# Patient Record
Sex: Male | Born: 2000 | Hispanic: Yes | Marital: Single | Smoking: Never smoker
Health system: Southern US, Community
[De-identification: ages and names within clinical notes are randomized; demographics above are authoritative.]

---
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
## 2019-03-29 ENCOUNTER — Other Ambulatory Visit: Payer: Self-pay

## 2019-03-29 ENCOUNTER — Emergency Department (HOSPITAL_COMMUNITY)
Admission: EM | Admit: 2019-03-29 | Discharge: 2019-03-29 | Disposition: A | Discharge status: Home / Self Care | Payer: Self-pay | Attending: Emergency Medicine | Admitting: Emergency Medicine

## 2019-03-29 DIAGNOSIS — Z046 Encounter for general psychiatric examination, requested by authority: Secondary | ICD-10-CM | POA: Insufficient documentation

## 2019-03-29 DIAGNOSIS — F19929 Other psychoactive substance use, unspecified with intoxication, unspecified: Secondary | ICD-10-CM | POA: Insufficient documentation

## 2019-03-29 DIAGNOSIS — F10920 Alcohol use, unspecified with intoxication, uncomplicated: Secondary | ICD-10-CM | POA: Insufficient documentation

## 2019-03-29 NOTE — ED Provider Notes (Signed)
[facility]-EMERGENCY DEPT Provider Note   CSN: [id number] Arrival date & time: 03/29/19  [id number]     History   Chief Complaint Chief Complaint  Patient presents with  . Alcohol Intoxication  . Medical Clearance    LEVEL 5 CAVEAT 2/2 AMS/INTOXICATION  HPI Jonathon Rogers is a 18 y.o. male.     18 year old male presents to the emergency department for medical clearance.  He was seen earlier in the evening, brought in by police after he was found in a vehicle at an intersection sleeping.  Had a legal blood draw and drug screen performed.  Was brought to jail for DUI charges, but was considered too intoxicated for acceptance.  Brought back to the emergency department for clearance.  Patient allegedly took multiple tablets of Xanax tonight.  He is lethargic and unable to contribute to history.  The history is provided by the patient. No language interpreter was used.  Alcohol Intoxication    No past medical history on file.  There are no active problems to display for this patient.   No past surgical history on file.      Home Medications    Prior to Admission medications   Medication Sig Start Date End Date Taking? Authorizing Provider  ibuprofen (ADVIL,MOTRIN) 400 MG tablet Take 1 tablet (400 mg total) by mouth every 6 (six) hours as needed. 06/18/15   Cheri Fowlerose, Kayla, PA-C    Family History No family history on file.  Social History Social History   Tobacco Use  . Smoking status: Never Smoker  Substance Use Topics  . Alcohol use: No  . Drug use: No     Allergies   Patient has no known allergies.   Review of Systems Review of Systems  Unable to perform ROS: Mental status change     Physical Exam Updated Vital Signs BP 102/66 (BP Location: Right Arm)   Pulse 77   Temp 97.8 F (36.6 C) (Oral)   Resp 16   SpO2 97%   Physical Exam Vitals signs and nursing note reviewed.  Constitutional:      General: He is not in acute distress.     Appearance: He is well-developed. He is not diaphoretic.     Comments: Alert to loud voice and sternal rubbing.  Will fall back to sleep when not stimulated.  HENT:     Head: Normocephalic and atraumatic.  Eyes:     General: No scleral icterus.    Conjunctiva/sclera: Conjunctivae normal.     Comments: Horizontal nystagmus to the left.  Neck:     Musculoskeletal: Normal range of motion.  Cardiovascular:     Rate and Rhythm: Normal rate and regular rhythm.     Pulses: Normal pulses.  Pulmonary:     Effort: Pulmonary effort is normal. No respiratory distress.     Comments: Respirations even and unlabored Musculoskeletal: Normal range of motion.  Skin:    General: Skin is warm and dry.     Coloration: Skin is not pale.     Findings: No erythema or rash.  Neurological:     Comments: Moving all extremities spontaneously  Psychiatric:        Behavior: Behavior normal.      ED Treatments / Results  Labs (all labs ordered are listed, but only abnormal results are displayed) Labs Reviewed - No data to display  EKG None  Radiology No results found.  Procedures Procedures (including critical care time)  Medications Ordered in ED Medications -  No data to display   Initial Impression / Assessment and Plan / ED Course  I have reviewed the triage vital signs and the nursing notes.  Pertinent labs & imaging results that were available during my care of the patient were reviewed by me and considered in my medical decision making (see chart for details).        18 year old presenting for medical clearance.  Is clinically intoxicated with suspected benzodiazepine ingestion.  Patient moving all extremities spontaneously.  Will awaken to sternal rubbing and loud voice, but promptly fall back to sleep when not stimulated.  He has reassuring vital signs upon my assessment at bedside.  Will require further metabolization prior to discharge.  Patient signed out to Armstead Peaks, PA-C at  change of shift who will reassess and disposition appropriately.   Final Clinical Impressions(s) / ED Diagnoses   Final diagnoses:  Drug intoxication with complication [facility])    ED Discharge Orders    None       Antonietta Breach, PA-C 03/29/19 9924    Fatima Blank, MD 03/29/19 [phone]

## 2019-03-29 NOTE — ED Notes (Signed)
Per [employer] officer, pt crawled out of bed and sat on the floor.  This RN entered room to pt sitting upright on the floor.  RN requested that pt return to bed for safety.  Pt assisted to stand, with unsteady gait, assisted back to bed. Provided water, per pt request.

## 2019-03-29 NOTE — ED Notes (Signed)
Pt to room 27, escorted by [employer]. [employer] at bedside.

## 2019-03-29 NOTE — Discharge Instructions (Signed)
Stop drinking alcohol and using drugs that are not prescribed to you.  Please return to the emergency department if you develop any new or worsening symptoms.

## 2019-03-29 NOTE — ED Triage Notes (Signed)
Pt arrived via [facility] for medical clearance

## 2019-03-29 NOTE — ED Notes (Signed)
Pt easily awoken.  Pt with slurred, incomprehensible speech.

## 2019-03-29 NOTE — ED Notes (Signed)
Pt DC under police custody. Pt ambulatory, stable, no s/s of distress.

## 2019-03-29 NOTE — ED Provider Notes (Signed)
Signout from [employer], PA-C at shift change See previous providers note for full H&P  Briefly, patient is intoxicated.  He was found at an intersection sleeping.  He was brought to [facility] for DUI charges, however was too intoxicated for acceptance.  He was brought here for further evaluation and metabolism.  Patient also allegedly took Xanax earlier.  Plan to observe and metabolize to [employer] custody.  Physical Exam  BP (!) 112/59   Pulse 89   Temp 97.8 F (36.6 C) (Oral)   Resp 17   SpO2 94%   Physical Exam Vitals signs and nursing note reviewed. Exam conducted with a chaperone present.  Constitutional:      General: He is not in acute distress.    Appearance: He is not ill-appearing or toxic-appearing.     Comments: Somnolent, able to walk to the door, however is very unsteady  HENT:     Head: Normocephalic and atraumatic.  Eyes:     Conjunctiva/sclera: Conjunctivae normal.     Comments: Horizontal nystagmus  Cardiovascular:     Rate and Rhythm: Normal rate.  Pulmonary:     Effort: Pulmonary effort is normal.  Musculoskeletal: Normal range of motion.  Skin:    General: Skin is warm.  Neurological:     Comments: Intoxicated     ED Course/Procedures   Clinical Course as of Mar 28 1104  Fri Mar 29, 2019  1104 On reassessment, patient is able to walk to the door, however cannot walk without assistance.  [employer] unable to take him into custody like this.  We will continue to monitor until patient metabolizes and is able to walk without assistance.   [AL]    Clinical Course User Index [AL] Frederica Kuster, PA-C    Procedures  MDM  Patient finally able to ambulate on his own with a steady gait without signs of distress.  Patient will be discharged to police custody.       Frederica Kuster, PA-C 03/29/19 1355    Carmin Muskrat, MD 04/01/19 1800

## 2022-12-26 ENCOUNTER — Encounter (HOSPITAL_COMMUNITY): Payer: Self-pay

## 2022-12-26 ENCOUNTER — Emergency Department (HOSPITAL_COMMUNITY): Payer: Self-pay

## 2022-12-26 ENCOUNTER — Emergency Department (HOSPITAL_COMMUNITY)
Admission: EM | Admit: 2022-12-26 | Discharge: 2022-12-26 | Disposition: A | Discharge status: Home / Self Care | Payer: Self-pay | Attending: Emergency Medicine | Admitting: Emergency Medicine

## 2022-12-26 ENCOUNTER — Other Ambulatory Visit: Payer: Self-pay

## 2022-12-26 DIAGNOSIS — S60414A Abrasion of right ring finger, initial encounter: Secondary | ICD-10-CM | POA: Insufficient documentation

## 2022-12-26 DIAGNOSIS — S51811A Laceration without foreign body of right forearm, initial encounter: Secondary | ICD-10-CM | POA: Insufficient documentation

## 2022-12-26 DIAGNOSIS — W25XXXA Contact with sharp glass, initial encounter: Secondary | ICD-10-CM | POA: Insufficient documentation

## 2022-12-26 DIAGNOSIS — Z23 Encounter for immunization: Secondary | ICD-10-CM | POA: Insufficient documentation

## 2022-12-26 MED ORDER — CEPHALEXIN 500 MG PO CAPS: 500.0000 mg | ORAL_CAPSULE | Freq: Four times a day (QID) | ORAL | 0 refills | Status: AC

## 2022-12-26 MED ORDER — IBUPROFEN 600 MG PO TABS: 600.0000 mg | ORAL_TABLET | Freq: Four times a day (QID) | ORAL | 0 refills | Status: AC | PRN

## 2022-12-26 MED ORDER — TETANUS-DIPHTH-ACELL PERTUSSIS 5-2.5-18.5 LF-MCG/0.5 IM SUSY
0.5000 mL | PREFILLED_SYRINGE | Freq: Once | INTRAMUSCULAR | Status: AC
  Administered 2022-12-26: 0.5 mL via INTRAMUSCULAR
  Filled 2022-12-26: qty 0.5

## 2022-12-26 MED ORDER — LIDOCAINE-EPINEPHRINE (PF) 2 %-1:200000 IJ SOLN
10.0000 mL | Freq: Once | INTRAMUSCULAR | Status: AC
  Administered 2022-12-26: 10 mL
  Filled 2022-12-26: qty 20

## 2022-12-26 MED ORDER — HYDROCODONE-ACETAMINOPHEN 5-325 MG PO TABS
1.0000 | ORAL_TABLET | Freq: Once | ORAL | Status: AC
  Administered 2022-12-26: 1 via ORAL
  Filled 2022-12-26: qty 1

## 2022-12-26 NOTE — ED Provider Notes (Signed)
[place] [facility] AT [facility] Provider Note   CSN: [id number] Arrival date & time: 12/26/22  1405     History  Chief Complaint  Patient presents with   Laceration    Jonathon Rogers is a 22 y.o. male.   Laceration   22 year old male presents emergency department with complaints of laceration.  Patient states he was moving glass earlier when the glass was struck and subsequently broke causing 3-4 cuts  bilateral upper extremities.  He states the largest cut is noted on the back of his right wrist.  Denies any weakness or sensory deficits.  Presents emergency department for repair.  Denies trauma elsewhere.  No significant pertinent past medical history.  Home Medications Prior to Admission medications   Medication Sig Start Date End Date Taking? Authorizing Provider  cephALEXin (KEFLEX) 500 MG capsule Take 1 capsule (500 mg total) by mouth 4 (four) times daily. 12/26/22  Yes Sherian Maroon A, PA  ibuprofen (ADVIL) 600 MG tablet Take 1 tablet (600 mg total) by mouth every 6 (six) hours as needed. 12/26/22  Yes Peter Garter, PA      Allergies    Patient has no known allergies.    Review of Systems   Review of Systems  All other systems reviewed and are negative.   Physical Exam Updated Vital Signs BP (!) 152/99 (BP Location: Right Arm)   Pulse (!) 129   Temp 98 F (36.7 C) (Oral)   Resp 17   Wt 74 kg   SpO2 97%  Physical Exam Vitals and nursing note reviewed.  Constitutional:      General: He is not in acute distress.    Appearance: He is well-developed.  HENT:     Head: Normocephalic and atraumatic.  Eyes:     Conjunctiva/sclera: Conjunctivae normal.  Cardiovascular:     Rate and Rhythm: Normal rate and regular rhythm.     Heart sounds: No murmur heard. Pulmonary:     Effort: Pulmonary effort is normal. No respiratory distress.     Breath sounds: Normal breath sounds.  Abdominal:     Palpations: Abdomen is soft.      Tenderness: There is no abdominal tenderness.  Musculoskeletal:        General: No swelling.     Cervical back: Neck supple.     Comments: Patient with full range of motion of bilateral shoulders, elbows, wrists, digits.  Patient with skin abrasion appreciated on the dorsal aspect of right fourth digits with no active bleeding or foreign body appreciated.  Patient with 1.5 to 2 cm arced laceration appreciated on the dorsal aspect of the distal ulna.  Palpable firm foreign body.  No active bleeding.  No appreciable ligamentous or tendinous injury.  Radial pulses 2+ bilaterally.  No obvious other trauma.  No loss of sensation distally.  Skin:    General: Skin is warm and dry.     Capillary Refill: Capillary refill takes less than 2 seconds.  Neurological:     Mental Status: He is alert.  Psychiatric:        Mood and Affect: Mood normal.     ED Results / Procedures / Treatments   Labs (all labs ordered are listed, but only abnormal results are displayed) Labs Reviewed - No data to display  EKG None  Radiology DG Wrist Complete Right  Result Date: 12/26/2022 CLINICAL DATA:  Laceration right lateral wrist from glass. EXAM: RIGHT WRIST - COMPLETE 3+ VIEW COMPARISON:  None  Available. FINDINGS: No acute fracture or dislocation identified. Soft tissue injury with associated underlying debris noted in the soft tissues dorsal and immediately adjacent to the distal ulna. There are also are some soft tissue densities in the distal forearm which may or may not be related to the acute injury. No bony lesions or arthropathy. IMPRESSION: Soft tissue injury with underlying debris noted in the soft tissues dorsal and immediately adjacent to the distal ulna. There are also some soft tissue densities in the distal forearm which may or may not be related to the acute injury. Electronically Signed   By: Irish Lack M.D.   On: 12/26/2022 15:02    Procedures .Marland KitchenLaceration Repair  Date/Time: 12/26/2022 4:10  PM  Performed by: Peter Garter, PA Authorized by: Peter Garter, PA   Consent:    Consent obtained:  Verbal   Consent given by:  Patient   Risks, benefits, and alternatives were discussed: yes     Risks discussed:  Infection, need for additional repair, nerve damage, vascular damage, tendon damage, retained foreign body, poor cosmetic result, poor wound healing and pain   Alternatives discussed:  No treatment, delayed treatment, observation and referral Universal protocol:    Procedure explained and questions answered to patient or proxy's satisfaction: yes     Patient identity confirmed:  Verbally with patient Anesthesia:    Anesthesia method:  Local infiltration   Local anesthetic:  Lidocaine 2% WITH epi Laceration details:    Location:  Shoulder/arm   Shoulder/arm location:  R lower arm   Length (cm):  2 Pre-procedure details:    Preparation:  Patient was prepped and draped in usual sterile fashion and imaging obtained to evaluate for foreign bodies Exploration:    Limited defect created (wound extended): no     Hemostasis achieved with:  Direct pressure   Imaging obtained: x-ray     Imaging outcome: foreign body noted     Wound exploration: wound explored through full range of motion and entire depth of wound visualized     Contaminated: yes   Treatment:    Area cleansed with:  Saline and povidone-iodine   Irrigation solution:  Sterile water   Irrigation volume:  250cc   Irrigation method:  Syringe   Visualized foreign bodies/material removed: no     Debridement:  None   Undermining:  None   Scar revision: no   Skin repair:    Repair method:  Sutures   Suture size:  4-0   Suture material:  Prolene   Suture technique:  Simple interrupted   Number of sutures:  4 Approximation:    Approximation:  Close Repair type:    Repair type:  Simple Post-procedure details:    Dressing:  Bulky dressing and non-adherent dressing   Procedure completion:  Tolerated well,  no immediate complications     Medications Ordered in ED Medications  lidocaine-EPINEPHrine (XYLOCAINE W/EPI) 2 %-1:200000 (PF) injection 10 mL (has no administration in time range)  Tdap (BOOSTRIX) injection 0.5 mL (0.5 mLs Intramuscular Given 12/26/22 1428)  HYDROcodone-acetaminophen (NORCO/VICODIN) 5-325 MG per tablet 1 tablet (1 tablet Oral Given 12/26/22 1428)    ED Course/ Medical Decision Making/ A&P                             Medical Decision Making Amount and/or Complexity of Data Reviewed Radiology: ordered.  Risk Prescription drug management.   This patient presents to the ED for  concern of laceration, this involves an extensive number of treatment options, and is a complaint that carries with it a high risk of complications and morbidity.  The differential diagnosis includes foreign body retainment, neurovascular compromise, ligamentous/tendinous injury, fracture, dislocation   Co morbidities that complicate the patient evaluation  See HPI   Additional history obtained:  Additional history obtained from EMR External records from outside source obtained and reviewed including hospital records   Lab Tests:  N/a   Imaging Studies ordered:  I ordered imaging studies including left wrist x-ray I independently visualized and interpreted imaging which showed soft tissue injury with underlying debris noted in soft tissue dorsally immediately adjacent to the distal ulna.  Soft tissue densities in distal forearm I agree with the radiologist interpretation  Cardiac Monitoring: / EKG:  The patient was maintained on a cardiac monitor.  I personally viewed and interpreted the cardiac monitored which showed an underlying rhythm of: Sinus rhythm   Consultations Obtained:  N/a   Problem List / ED Course / Critical interventions / Medication management  Laceration I ordered medication including Norco, Tdap, Xylocaine with   Reevaluation of the patient after  these medicines showed that the patient improved I have reviewed the patients home medicines and have made adjustments as needed   Social Determinants of Health:  Denies tobacco, illicit drug use   Test / Admission - Considered:  Laceration left wrist Vitals signs significant for initially tachycardic but patient's heart rate decreased with time elapsed and medicines administered on the emergency department. Otherwise within normal range and stable throughout visit. Laboratory/imaging studies significant for: See above 22 year old male presents emergency department after suffering laceration from a piece of glass.  Foreign body removed with laceration repaired in manner as depicted above.  Patient's tetanus updated while in emergency department given unknown last Tdap vaccination.  Will place patient on.  Antibiotics given dirty appearance of wound as well as appreciable visualization of the distal ulna upon examination.  Patient without any neurovascular compromise or appreciable weakness.  Patient recommended rest, ice, elevation as well as proper wound care with suture placement at home.  Patient recommended follow-up in 7 to 10 days for suture removal.  Treatment plan discussed length with patient and he acknowledged understanding was agreeable to said plan. Worrisome signs and symptoms were discussed with the patient, and the patient acknowledged understanding to return to the ED if noticed. Patient was stable upon discharge.          Final Clinical Impression(s) / ED Diagnoses Final diagnoses:  Laceration of right forearm, initial encounter    Rx / DC Orders ED Discharge Orders          Ordered    ibuprofen (ADVIL) 600 MG tablet  Every 6 hours PRN        12/26/22 1553    cephALEXin (KEFLEX) 500 MG capsule  4 times daily        12/26/22 1553              Peter Garter, [place] 12/26/22 1613    Linwood Dibbles, MD 12/27/22 [phone]

## 2022-12-26 NOTE — ED Triage Notes (Addendum)
C/o laceration to right wrist from glass at work.  Sensation and radial pulse intact. Patient able to move fingers Denies blood thinner usage [facility] last tetanus.

## 2022-12-26 NOTE — Discharge Instructions (Addendum)
As discussed, wound was repaired using 4 nonabsorbable sutures.  Sutures will need to be taken out in 7 to 10 days.  Take Tylenol/Motrin as needed for pain.  Will put on antibiotics given closeness of cut to bone.  Recommend follow-up in urgent care, family doctor or at the emergency department for suture removal.  Please not hesitate to return to emergency department for worrisome signs and symptoms we discussed become apparent.

## 2023-01-07 ENCOUNTER — Emergency Department (HOSPITAL_COMMUNITY)
Admission: EM | Admit: 2023-01-07 | Discharge: 2023-01-07 | Disposition: A | Discharge status: Home / Self Care | Payer: Self-pay | Attending: Emergency Medicine | Admitting: Emergency Medicine

## 2023-01-07 ENCOUNTER — Other Ambulatory Visit: Payer: Self-pay

## 2023-01-07 DIAGNOSIS — S61411D Laceration without foreign body of right hand, subsequent encounter: Secondary | ICD-10-CM | POA: Insufficient documentation

## 2023-01-07 DIAGNOSIS — Z4802 Encounter for removal of sutures: Secondary | ICD-10-CM | POA: Insufficient documentation

## 2023-01-07 DIAGNOSIS — X58XXXD Exposure to other specified factors, subsequent encounter: Secondary | ICD-10-CM | POA: Insufficient documentation

## 2023-01-07 NOTE — ED Provider Notes (Signed)
[name] [facility] AT [facility] Provider Note   CSN: [id number] Arrival date & time: 01/07/23  1346     History  Chief Complaint  Patient presents with   Suture / Staple Removal    Jonathon Rogers is a 22 y.o. male with no significant past medical history who presents the emergency department for suture removal.  Patient was seen in the ER on 5/20 after he was at work and accidentally cut his right hand with some broken glass.  He had 4 sutures placed in the hand.  States has been healing well and has had no problems.   Suture / Staple Removal       Home Medications Prior to Admission medications   Medication Sig Start Date End Date Taking? Authorizing Provider  cephALEXin (KEFLEX) 500 MG capsule Take 1 capsule (500 mg total) by mouth 4 (four) times daily. 12/26/22   Peter Garter, PA  ibuprofen (ADVIL) 600 MG tablet Take 1 tablet (600 mg total) by mouth every 6 (six) hours as needed. 12/26/22   Peter Garter, PA      Allergies    Patient has no known allergies.    Review of Systems   Review of Systems  Skin:  Positive for wound.  All other systems reviewed and are negative.   Physical Exam Updated Vital Signs BP (!) 150/92 (BP Location: Left Arm)   Pulse 89   Temp 98 F (36.7 C) (Oral)   Resp 16   SpO2 99%  Physical Exam Vitals and nursing note reviewed.  Constitutional:      Appearance: Normal appearance.  HENT:     Head: Normocephalic and atraumatic.  Eyes:     Conjunctiva/sclera: Conjunctivae normal.  Pulmonary:     Effort: Pulmonary effort is normal. No respiratory distress.  Skin:    General: Skin is warm and dry.     Comments: Well-healing laceration noted to the lateral dorsum of the right hand with 4 sutures in place.  No evidence of infection  Neurological:     Mental Status: He is alert.  Psychiatric:        Mood and Affect: Mood normal.        Behavior: Behavior normal.     ED Results / Procedures /  Treatments   Labs (all labs ordered are listed, but only abnormal results are displayed) Labs Reviewed - No data to display  EKG None  Radiology No results found.  Procedures .Suture Removal  Date/Time: 01/07/2023 2:32 PM  Performed by: Su Monks, PA-C Authorized by: Su Monks, PA-C   Consent:    Consent obtained:  Verbal   Consent given by:  Patient   Risks, benefits, and alternatives were discussed: yes     Risks discussed:  Bleeding, pain and wound separation   Alternatives discussed:  No treatment Universal protocol:    Procedure explained and questions answered to patient or proxy's satisfaction: yes     Patient identity confirmed:  Provided demographic data Location:    Location:  Upper extremity   Upper extremity location:  Hand   Hand location:  R hand Procedure details:    Wound appearance:  No signs of infection, good wound healing and clean   Number of sutures removed:  4 Post-procedure details:    Post-removal:  No dressing applied   Procedure completion:  Tolerated well, no immediate complications     Medications Ordered in ED Medications - No data to display  ED Course/ Medical Decision Making/ A&P                             Medical Decision Making  Patient is a 22 y.o. male who presents to the emergency department for suture removal.  On exam, patient has a partially healed laceration to the right hand with 4 suture(s) in place.   Suture(s) removed and patient tolerated procedure well. Discharged in stable condition. Given instructions for wound care.    Final Clinical Impression(s) / ED Diagnoses Final diagnoses:  Visit for suture removal    Rx / DC Orders ED Discharge Orders     None      Portions of this report may have been transcribed using voice recognition software. Every effort was made to ensure accuracy; however, inadvertent computerized transcription errors may be present.    Jeanella Flattery 01/07/23 1433    Wynetta Fines, MD 01/07/23 1555

## 2023-01-07 NOTE — ED Triage Notes (Signed)
Pt req suture removal from right wrist

## 2023-01-07 NOTE — Discharge Instructions (Signed)
You were seen in the emergency department for suture/staple removal.  Your wound appears to be healing well. Please keep it clean and appropriately dressed.   

## 2023-09-06 ENCOUNTER — Other Ambulatory Visit: Payer: Self-pay | Admitting: Physician Assistant

## 2023-09-06 DIAGNOSIS — R7401 Elevation of levels of liver transaminase levels: Secondary | ICD-10-CM

## 2023-12-08 ENCOUNTER — Encounter: Payer: Self-pay | Admitting: Primary Care

## 2023-12-08 ENCOUNTER — Telehealth: Payer: Self-pay | Admitting: Primary Care

## 2023-12-08 ENCOUNTER — Ambulatory Visit (INDEPENDENT_AMBULATORY_CARE_PROVIDER_SITE_OTHER): Payer: Self-pay | Admitting: Primary Care

## 2023-12-08 VITALS — BP 120/70 | HR 71 | Ht 66.0 in | Wt 187.4 lb

## 2023-12-08 DIAGNOSIS — R052 Subacute cough: Secondary | ICD-10-CM

## 2023-12-08 DIAGNOSIS — R0683 Snoring: Secondary | ICD-10-CM

## 2023-12-08 NOTE — Progress Notes (Signed)
 @Patient  ID: Karlyne Oxford, male    DOB: 01/07/2001, 23 y.o.   MRN: [id number]  Chief Complaint  Patient presents with   Consult    Sleep consult     Referring provider: No ref. provider found  HPI: 23 year old male, current marijuana use. No significant medical history.  12/08/2023 Discussed the use of AI scribe software for clinical note transcription with the patient, who gave verbal consent to proceed.  History of Present Illness   Tryone Stills is a 23 year old male who presents for evaluation of possible sleep apnea. He was referred by his primary care physician for evaluation of possible sleep apnea following a syncopal episode attributed to dehydration.  He describes his sleep routine as going to bed between 9:30 PM and 11:00 PM, falling asleep within a few minutes, and waking up once or twice during the night to use the restroom. He starts his day at 6:00 AM and does not feel significantly tired during the day. He feels sleepy when in a car. 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  He has been told that he snores and has on occasion woken up gasping or choking, which he attributes to mucus in his lungs from smoking. No significant daytime sleepiness, stating he feels energetic upon waking. He scores low on a daytime sleepiness scale, Epworth 6/24.   He smokes marijuana regularly to relax after work. He denies using it to aid sleep but acknowledges it might contribute to feeling sleepy during the day. He does not consume alcohol regularly.  He has a history of a syncopal episode attributed to dehydration and a single instance of elevated blood pressure, which normalized on subsequent checks. No history of surgeries and denies any significant medical history.      No Known Allergies  Immunization History  Administered Date(s) Administered   DTaP 03/22/2000, 05/23/2000, 07/21/2000, 06/04/2001, 01/14/2004   HIB (PRP-OMP) 03/22/2000, 05/23/2000,  07/21/2000, 06/04/2001   HPV Quadrivalent 08/23/2013   Hepatitis A 11/01/2002, 12/17/2003   Hepatitis B April 30, 2000, 03/22/2000, 01/15/2001   IPV 03/22/2000, 05/23/2000, 07/21/2000, 01/14/2004   Influenza-Unspecified 08/23/2013   MMR 01/15/2001, 01/14/2004   Meningococcal Conjugate 08/23/2013   Pneumococcal Conjugate-13 08/18/2001   Pneumococcal-Unspecified 07/21/2000, 11/03/2000   Tdap 04/29/2011, 12/26/2022   Varicella 01/15/2001    No past medical history on file.  Tobacco History: Social History   Tobacco Use  Smoking Status Never  Smokeless Tobacco Not on file  Tobacco Comments   Vape someday. [facility]/2025   Counseling given: Not Answered Tobacco comments: Vape someday. [facility]/2025   Outpatient Medications Prior to Visit  Medication Sig Dispense Refill   cephALEXin  (KEFLEX ) 500 MG capsule Take 1 capsule (500 mg total) by mouth 4 (four) times daily. (Patient not taking: Reported on 12/08/2023) 20 capsule 0   ibuprofen  (ADVIL ) 600 MG tablet Take 1 tablet (600 mg total) by mouth every 6 (six) hours as needed. (Patient not taking: Reported on 12/08/2023) 30 tablet 0   No facility-administered medications prior to visit.   Review of Systems  Review of Systems  Constitutional: Negative.  Negative for fatigue and unexpected weight change.  HENT:  Positive for congestion.   Respiratory: Negative.    Cardiovascular: Negative.   Psychiatric/Behavioral:  Negative for sleep disturbance.    Physical Exam  BP 120/70 (BP Location: Left Arm, Patient Position: Sitting, Cuff Size: Normal)   Pulse 71   Ht 5\' 6"  (1.676 m)   Wt 187 lb  6.4 oz (85 kg)   SpO2 96%   BMI 30.25 kg/m  Physical Exam Constitutional:      General: He is not in acute distress.    Appearance: Normal appearance. He is not ill-appearing.  HENT:     Head: Normocephalic and atraumatic.     Mouth/Throat:     Mouth: Mucous membranes are moist.     Pharynx: Oropharynx is clear.  Cardiovascular:      Rate and Rhythm: Normal rate and regular rhythm.  Pulmonary:     Effort: Pulmonary effort is normal.     Breath sounds: Normal breath sounds. No wheezing, rhonchi or rales.     Comments: CTA Musculoskeletal:        General: Normal range of motion.  Skin:    General: Skin is warm and dry.  Neurological:     General: No focal deficit present.     Mental Status: He is alert and oriented to person, place, and time. Mental status is at baseline.  Psychiatric:        Mood and Affect: Mood normal.        Behavior: Behavior normal.        Thought Content: Thought content normal.        Judgment: Judgment normal.      Lab Results:  CBC No results found for: "WBC", "RBC", "HGB", "HCT", "PLT", "MCV", "MCH", "MCHC", "RDW", "LYMPHSABS", "MONOABS", "EOSABS", "BASOSABS"  BMET No results found for: "NA", "K", "CL", "CO2", "GLUCOSE", "BUN", "CREATININE", "CALCIUM", "GFRNONAA", "GFRAA"  BNP No results found for: "BNP"  ProBNP No results found for: "PROBNP"  Imaging: No results found.   Assessment & Plan:   1. Snoring (Primary)  2. Subacute cough   Assessment and Plan    Cough with Mucus Production Intermittent cough with mucus production, likely related to smoking habits. Lungs are clear on examination. Possible postnasal drip or smoking-related irritation. - Recommend Mucinex over-the-counter, 1-2 times daily as needed to loosen congestion and smoking cessation   Possible Obstructive Sleep Apnea Low suspicion for obstructive sleep apnea based on low daytime sleepiness score and lack of significant daytime fatigue. Snoring and occasional nocturnal choking present. Marijuana use may contribute to drowsiness. Discussed risks of sleep apnea, including increased risk for cardiac arrhythmias and accidents, especially in construction work. Discussed potential impact of weight and smoking on sleep apnea. - Recommend home sleep study if affordable, to be mailed for three nights. -  Counsel on positional sleep strategies: side sleeping or using a wedge pillow to reduce snoring. - Advise to avoid alcohol before bed and maintain current weight to prevent worsening of sleep apnea. - Counsel on reducing marijuana use to decrease risk of lung issues and potential impact on sleep.   Antonio Baumgarten, NP 12/08/2023

## 2023-12-08 NOTE — Patient Instructions (Addendum)
 Today, you were evaluated for possible sleep apnea following a syncopal episode attributed to dehydration. You described your sleep routine and symptoms, including snoring and occasional nocturnal choking. We also discussed your smoking habits and their potential impact on your symptoms.  YOUR PLAN: -COUGH WITH MUCUS PRODUCTION: Your intermittent cough with mucus production is likely related to your smoking habits. To help loosen the congestion, you can take Mucinex over-the-counter, 1-2 times daily as needed.  -POSSIBLE OBSTRUCTIVE SLEEP APNEA: Obstructive sleep apnea is a condition where your breathing stops and starts during sleep. Although your daytime sleepiness score is low, your snoring and occasional nocturnal choking suggest a possible mild case. We recommend a home sleep study if affordable, which will be mailed to you for three nights. Additionally, try sleeping on your side or using a wedge pillow to reduce snoring, avoid alcohol before bed, and maintain your current weight. Reducing marijuana use may also help decrease lung issues and improve your sleep.  INSTRUCTIONS: Please follow up with a home sleep study if you decide to proceed with it. If you have any questions or concerns, feel free to contact our office.        Sleep Apnea  Sleep apnea is a condition that affects your breathing while you are sleeping. Your tongue or soft tissue in your throat may block the flow of air while you sleep. You may have shallow breathing or stop breathing for short periods of time. People with sleep apnea may snore loudly. There are three kinds of sleep apnea: Obstructive sleep apnea. This kind is caused by a blocked or collapsed airway. This is the most common. Central sleep apnea. This kind happens when the part of the brain that controls breathing does not send the correct signals to the muscles that control breathing. Mixed sleep apnea. This is a combination of obstructive and central  sleep apnea. What are the causes? The most common cause of sleep apnea is a collapsed or blocked airway. What increases the risk? Being very overweight. Having family members with sleep apnea. Having a tongue or tonsils that are larger than normal. Having a small airway or jaw problems. Being older. What are the signs or symptoms? Loud snoring. Restless sleep. Trouble staying asleep. Being sleepy or tired during the day. Waking up gasping or choking. Having a headache in the morning. Mood swings. Having a hard time remembering things and concentrating. How is this diagnosed? A medical history. A physical exam. A sleep study. This is also called a polysomnography test. This test is done at a sleep lab or in your home while you are sleeping. How is this treated? Treatment may include: Sleeping on your side. Losing weight if you're overweight. Wearing an oral appliance. This is a mouthpiece that moves your lower jaw forward. Using a positive airway pressure (PAP) device to keep your airways open while you sleep, such as: A continuous positive airway pressure (CPAP) device. This device gives forced air through a mask when you breathe out. This keeps your airways open. A bilevel positive airway pressure (BIPAP) device. This device gives forced air through a mask when you breathe in and when you breathe out to keep your airways open. Having surgery if other treatments do not work. If your sleep apnea is not treated, you may be at risk for: Heart failure. Heart attack. Stroke. Type 2 diabetes or a problem with your blood sugar called insulin resistance. Follow these instructions at home: Medicines Take your medicines only as told by  your health care provider. Avoid alcohol, medicines to help you relax, and certain pain medicines. These may make sleep apnea worse. General instructions Do not smoke, vape, or use products with nicotine or tobacco in them. If you need help quitting,  talk with your provider. If you were given a PAP device to open your airway while you sleep, use it as told by your provider. If you're having surgery, make sure to tell your provider you have sleep apnea. You may need to bring your PAP device with you. Contact a health care provider if: The PAP device that you were given to use during sleep bothers you or does not seem to be working. You do not feel better or you feel worse. Get help right away if: You have trouble breathing. You have chest pain. You have trouble talking. One side of your body feels weak. A part of your face is hanging down. These symptoms may be an emergency. Call 911 right away. Do not wait to see if the symptoms will go away. Do not drive yourself to the hospital. This information is not intended to replace advice given to you by your health care provider. Make sure you discuss any questions you have with your health care provider. Document Revised: 04/27/2023 Document Reviewed: 09/29/2022 [employer] Patient Education  2024 [employer].

## 2023-12-08 NOTE — Telephone Encounter (Signed)
 The out of pocket for a HST at our office out of pocket is around $400 with the discount. Without the discount it is $800. [employer] is more expensive, they dont even take Medicaid.

## 2023-12-08 NOTE — Telephone Encounter (Signed)
 What is out of pocket cost for HST with either [employer] or our office if patient does not have health insurance

## 2023-12-08 NOTE — Telephone Encounter (Signed)
 Ashlyn can you please let patient know the cost of the sleep study. If he wants to proceed I can order. Otherwise follow my recommendations and return if sleep symptoms worsens

## 2023-12-15 ENCOUNTER — Telehealth: Payer: Self-pay

## 2023-12-15 NOTE — Telephone Encounter (Signed)
 Thanks, please sign and close note

## 2023-12-15 NOTE — Telephone Encounter (Signed)
 Patient was informed of the cost of HST, patient states he does not want to process with getting a study done. However, he would follow-up if symptoms worsens.
